# Patient Record
Sex: Male | Born: 1980 | Hispanic: Yes | Marital: Single | Smoking: Former smoker
Health system: Southern US, Community
[De-identification: ages and names within clinical notes are randomized; demographics above are authoritative.]

## PROBLEM LIST (undated history)

## (undated) DIAGNOSIS — I10 Essential (primary) hypertension: Secondary | ICD-10-CM

## (undated) HISTORY — PX: TONSILLECTOMY: SUR1361

## (undated) HISTORY — PX: APPENDECTOMY: SHX54

---

## 2021-12-23 ENCOUNTER — Emergency Department (HOSPITAL_COMMUNITY): Payer: Medicaid - Out of State

## 2021-12-23 ENCOUNTER — Other Ambulatory Visit: Payer: Self-pay

## 2021-12-23 ENCOUNTER — Encounter (HOSPITAL_COMMUNITY): Payer: Self-pay

## 2021-12-23 ENCOUNTER — Emergency Department (HOSPITAL_COMMUNITY)
Admission: EM | Admit: 2021-12-23 | Discharge: 2021-12-23 | Disposition: A | Discharge status: Home / Self Care | Payer: PRIVATE HEALTH INSURANCE | Attending: Emergency Medicine | Admitting: Emergency Medicine

## 2021-12-23 DIAGNOSIS — I10 Essential (primary) hypertension: Secondary | ICD-10-CM | POA: Insufficient documentation

## 2021-12-23 DIAGNOSIS — Z79899 Other long term (current) drug therapy: Secondary | ICD-10-CM | POA: Diagnosis not present

## 2021-12-23 DIAGNOSIS — R200 Anesthesia of skin: Secondary | ICD-10-CM | POA: Diagnosis present

## 2021-12-23 HISTORY — DX: Essential (primary) hypertension: I10

## 2021-12-23 LAB — BASIC METABOLIC PANEL
Anion gap: 6 (ref 5–15)
BUN: 20 mg/dL (ref 6–20)
CO2: 25 mmol/L (ref 22–32)
Calcium: 8.9 mg/dL (ref 8.9–10.3)
Chloride: 106 mmol/L (ref 98–111)
Creatinine, Ser: 1.04 mg/dL (ref 0.61–1.24)
GFR, Estimated: >60 mL/min (ref >60)
Glucose, Bld: 108 mg/dL — ABNORMAL HIGH (ref 70–99)
Potassium: 3.6 mmol/L (ref 3.5–5.1)
Sodium: 137 mmol/L (ref 135–145)

## 2021-12-23 LAB — CBC
HCT: 46.9 % (ref 39.0–52.0)
Hemoglobin: 15.8 g/dL (ref 13.0–17.0)
MCH: 33.5 pg (ref 26.0–34.0)
MCHC: 33.7 g/dL (ref 30.0–36.0)
MCV: 99.6 fL (ref 80.0–100.0)
Platelets: 275 10*3/uL (ref 150–400)
RBC: 4.71 MIL/uL (ref 4.22–5.81)
RDW: 11.9 % (ref 11.5–15.5)
WBC: 8.2 10*3/uL (ref 4.0–10.5)
nRBC: 0 % (ref 0.0–0.2)

## 2021-12-23 LAB — TROPONIN I (HIGH SENSITIVITY)
Troponin I (High Sensitivity): 13 ng/L (ref <18)
Troponin I (High Sensitivity): 15 ng/L (ref <18)

## 2021-12-23 MED ORDER — AMLODIPINE BESYLATE 10 MG PO TABS: 10.0000 mg | ORAL_TABLET | Freq: Every day | ORAL | 0 refills | Status: AC

## 2021-12-23 MED ORDER — METOPROLOL TARTRATE 5 MG/5ML IV SOLN
5.0000 mg | Freq: Once | INTRAVENOUS | Status: AC
  Administered 2021-12-23: 5 mg via INTRAVENOUS
  Filled 2021-12-23: qty 5

## 2021-12-23 MED ORDER — AMLODIPINE BESYLATE 5 MG PO TABS
10.0000 mg | ORAL_TABLET | Freq: Once | ORAL | Status: AC
  Administered 2021-12-23: 10 mg via ORAL
  Filled 2021-12-23: qty 2

## 2021-12-23 NOTE — ED Notes (Signed)
Patient transported to MRI 

## 2021-12-23 NOTE — ED Triage Notes (Signed)
Pt c/o chest pain and lip numbness since 0400.

## 2021-12-23 NOTE — ED Provider Notes (Signed)
[facility]-EMERGENCY DEPT Provider Note   CSN: [id number] Arrival date & time: 12/23/21  2025     History  Chief Complaint  Patient presents with   Chest Pain    Brett Lopez is a 41 y.o. male.  Pt is a 30 y o wm with a hx of htn.  Pt presents to the ED today with cp, left arm numbness, and lip numbness. He lives in [place], but is here working.  He arrived in September and plans to be here the rest of this year.  He has been on Lisinopril 20 mg for his bp.  He thought his bp may be high, so he took an extra dose of his bp medication.  He still felt bad, so came in.  He was able to swallow.  He was able to eat without problems this am.  No sob.  No f/c.      Home Medications Prior to Admission medications   Medication Sig Start Date End Date Taking? Authorizing Provider  amLODipine (NORVASC) 10 MG tablet Take 1 tablet (10 mg total) by mouth daily. 12/23/21  Yes Jacalyn Lefevre, MD      Allergies    Penicillins    Review of Systems   Review of Systems  Cardiovascular:  Positive for chest pain.  Neurological:  Positive for numbness.  All other systems reviewed and are negative.  Physical Exam Updated Vital Signs BP (!) 166/108    Pulse 60    Temp 98.1 F (36.7 C) (Oral)    Resp 16    Ht 6' (1.829 m)    Wt 136.1 kg    SpO2 99%    BMI 40.69 kg/m  Physical Exam Vitals and nursing note reviewed.  Constitutional:      Appearance: He is well-developed.  HENT:     Head: Normocephalic and atraumatic.  Eyes:     Extraocular Movements: Extraocular movements intact.     Pupils: Pupils are equal, round, and reactive to light.  Cardiovascular:     Rate and Rhythm: Normal rate and regular rhythm.     Heart sounds: Normal heart sounds.  Pulmonary:     Effort: Pulmonary effort is normal.     Breath sounds: Normal breath sounds.  Abdominal:     General: Bowel sounds are normal.     Palpations: Abdomen is soft.  Musculoskeletal:        General: Normal range of  motion.     Cervical back: Normal range of motion and neck supple.  Skin:    General: Skin is warm.     Capillary Refill: Capillary refill takes less than 2 seconds.  Neurological:     General: No focal deficit present.     Mental Status: He is alert and oriented to person, place, and time.  Psychiatric:        Mood and Affect: Mood normal.        Behavior: Behavior normal.    ED Results / Procedures / Treatments   Labs (all labs ordered are listed, but only abnormal results are displayed) Labs Reviewed  BASIC METABOLIC PANEL - Abnormal; Notable for the following components:      Result Value   Glucose, Bld 108 (*)    All other components within normal limits  CBC  TROPONIN I (HIGH SENSITIVITY)  TROPONIN I (HIGH SENSITIVITY)    EKG None  Radiology CT Head Wo Contrast  Result Date: 12/23/2021 CLINICAL DATA:  41 year old male with history of  neurologic deficit. Acute stroke suspected. EXAM: CT HEAD WITHOUT CONTRAST TECHNIQUE: Contiguous axial images were obtained from the base of the skull through the vertex without intravenous contrast. COMPARISON:  No priors. FINDINGS: Brain: Cavum septum pellucidum (normal anatomical variant) incidentally noted. No evidence of acute infarction, hemorrhage, hydrocephalus, extra-axial collection or mass lesion/mass effect. Vascular: No hyperdense vessel or unexpected calcification. Skull: Normal. Negative for fracture or focal lesion. Sinuses/Orbits: Extensive mucosal thickening is noted in the maxillary sinuses bilaterally. Other: None. IMPRESSION: 1. No acute intracranial abnormalities. The appearance of the brain is normal. 2. Extensive mucosal thickening in the maxillary sinuses bilaterally, suggestive of chronic sinusitis. Electronically Signed   By: Trudie Reed M.D.   On: 12/23/2021 07:55   MR BRAIN WO CONTRAST  Result Date: 12/23/2021 CLINICAL DATA:  Lip numbness, concern for stroke EXAM: MRI HEAD WITHOUT CONTRAST TECHNIQUE:  Multiplanar, multiecho pulse sequences of the brain and surrounding structures were obtained without intravenous contrast. COMPARISON:  Same day CT head FINDINGS: Brain: Is no evidence of acute intracranial hemorrhage, extra-axial fluid collection, or acute infarct. Parenchymal volume is normal. The ventricles are normal in size. Incidental note is made of a cavum septum pellucidum et vergae. There is no solid mass lesion.  There is no midline shift. Vascular: Normal flow voids. Skull and upper cervical spine: Normal marrow signal. Sinuses/Orbits: There is extensive mucosal thickening throughout the maxillary sinuses. The globes and orbits are unremarkable. Other: There is a 1.4 cm T2 hyperintense lesion demonstrating diffusion restriction in the left frontoparietal scalp which may reflect a sebaceous cyst. IMPRESSION: No acute intracranial pathology. Electronically Signed   By: Lesia Hausen M.D.   On: 12/23/2021 09:02   DG Chest Port 1 View  Result Date: 12/23/2021 CLINICAL DATA:  Chest pain. EXAM: PORTABLE CHEST 1 VIEW COMPARISON:  None. FINDINGS: No consolidation. No visible pleural effusions or pneumothorax. Cardiomediastinal silhouette is accentuated by AP technique. IMPRESSION: No evidence of acute cardiopulmonary disease. Electronically Signed   By: Feliberto Harts M.D.   On: 12/23/2021 07:57    Procedures Procedures    Medications Ordered in ED Medications  amLODipine (NORVASC) tablet 10 mg (has no administration in time range)  metoprolol tartrate (LOPRESSOR) injection 5 mg (5 mg Intravenous Given 12/23/21 0801)    ED Course/ Medical Decision Making/ A&P                           Medical Decision Making  Sx improving with bp lowering.  MRI and Cardiac work up negative.  As he does not have access to his pcp in [place], he is given a rx for norvasc.  He is going to the store to get a bp cuff.  He is encouraged to eat a healthier diet and to stop vaping.  Return if worse.  F/u with pcp when  he gets home.        Final Clinical Impression(s) / ED Diagnoses Final diagnoses:  Hypertension, unspecified type    Rx / DC Orders ED Discharge Orders          Ordered    amLODipine (NORVASC) 10 MG tablet  Daily        12/23/21 1005              Jacalyn Lefevre, MD 12/23/21 1007

## 2021-12-23 NOTE — ED Notes (Signed)
Brett Lopez, and Blue top sent as saves.

## 2023-07-18 IMAGING — MR MR HEAD W/O CM
10 series · 48 of 48 positions shown · non-contrast
Comparison: Same day CT head

CLINICAL DATA: Lip numbness, concern for stroke

EXAM:
MRI HEAD WITHOUT CONTRAST
TECHNIQUE: Multiplanar, multiecho pulse sequences of the brain and surrounding
structures were obtained without intravenous contrast.

[Series 5: DWI · axial · 3.0mm · 1.36mm/px · z∈[-56,+94]mm · 9 of 108 slices shown (1 of 2)]
[im 1/108]
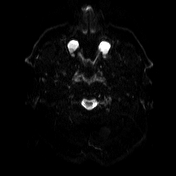
[im 14/108]
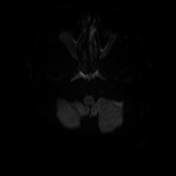
[im 27/108]
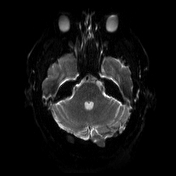
[im 41/108]
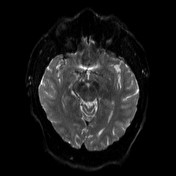
[im 54/108]
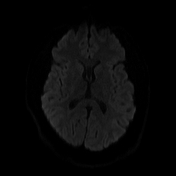
[im 67/108]
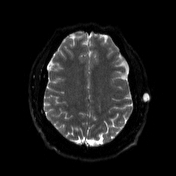
[im 81/108]
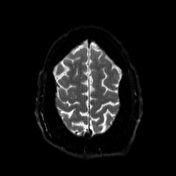
[im 94/108]
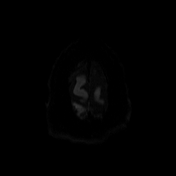
[im 108/108]
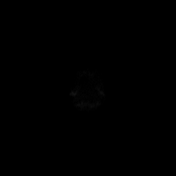

[Series 6: DWI · axial · 3.0mm · 1.36mm/px · z∈[-56,+94]mm · 4 of 54 slices shown (2 of 2)]
[im 1/54]
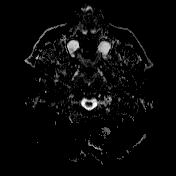
[im 18/54]
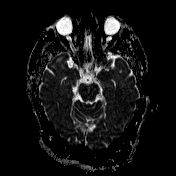
[im 36/54]
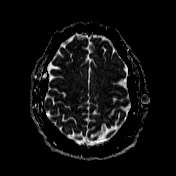
[im 54/54]
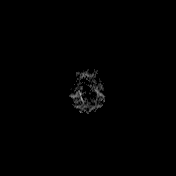

[Series 7: T1 · sagittal · 5.0mm · 0.75mm/px · 2 of 24 slices shown (1 of 2)]
[im 1/24]
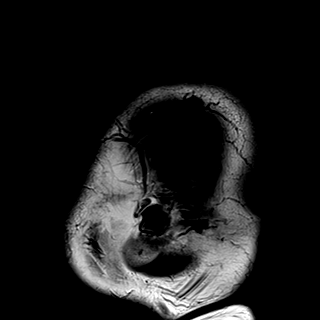
[im 24/24]
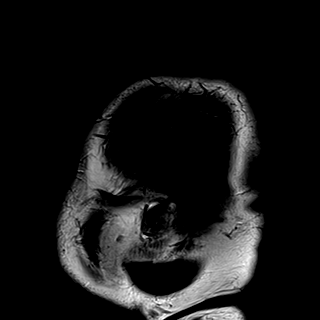

[Series 8: T2 · axial · 5.0mm · 0.62mm/px · z∈[-58,+95]mm · 2 of 26 slices shown (1 of 2)]
[im 1/26]
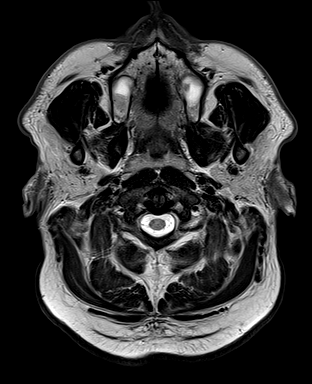
[im 26/26]
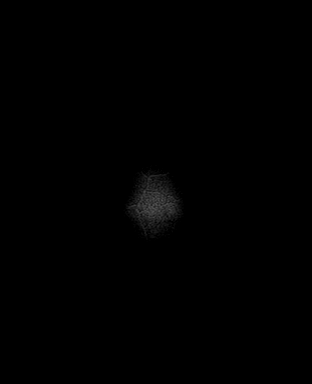

[Series 9: swi_images · axial · 3.0mm · 0.75mm/px · z∈[-53,+91]mm · 4 of 52 slices shown]
[im 1/52]
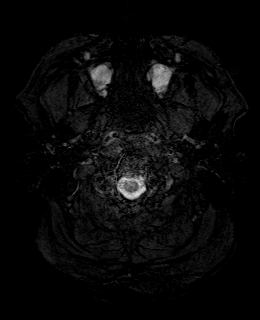
[im 18/52]
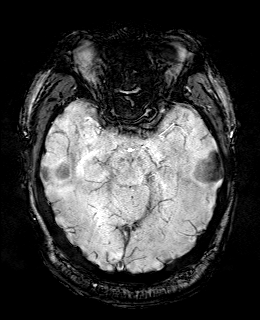
[im 35/52]
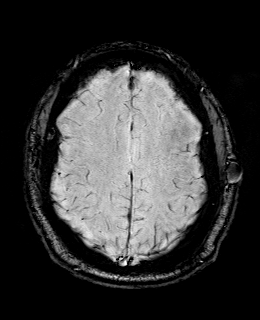
[im 52/52]
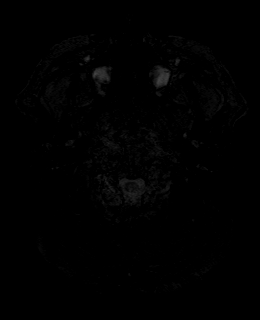

[Series 11: FLAIR · axial · 3.0mm · 0.75mm/px · z∈[-56,+94]mm · 4 of 54 slices shown]
[im 1/54]
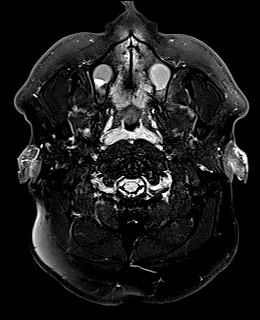
[im 18/54]
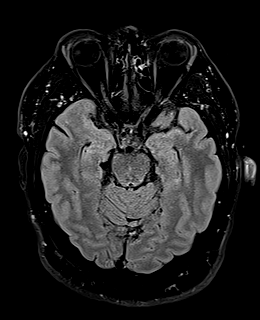
[im 36/54]
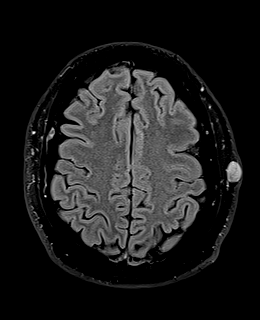
[im 54/54]
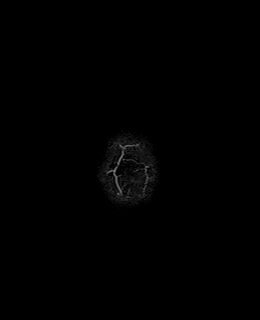

[Series 12: T1 · axial · 1.0mm · 0.94mm/px · z∈[-61,+91]mm · 13 of 160 slices shown (2 of 2)]
[im 1/160]
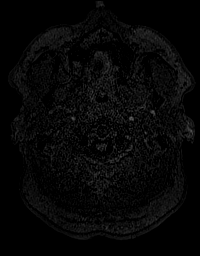
[im 14/160]
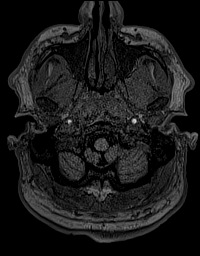
[im 27/160]
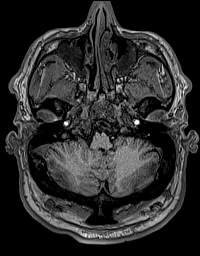
[im 40/160]
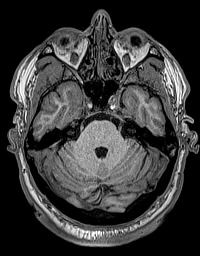
[im 54/160]
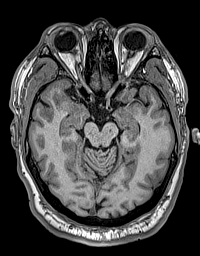
[im 67/160]
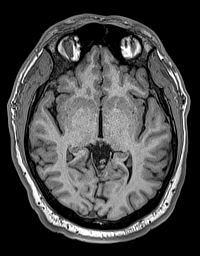
[im 80/160]
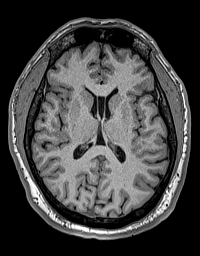
[im 93/160]
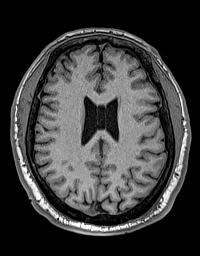
[im 107/160]
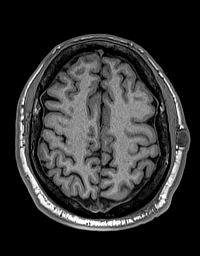
[im 120/160]
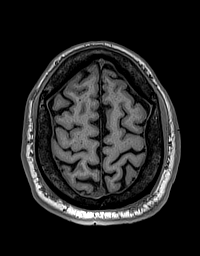
[im 133/160]
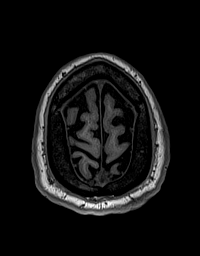
[im 146/160]
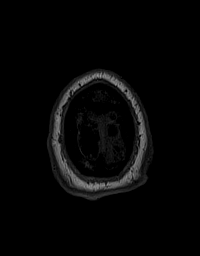
[im 160/160]
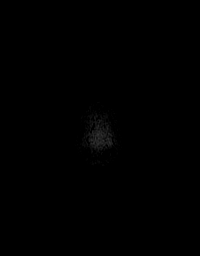

[Series 13: cor dwi_tracew · coronal · 5.0mm · 1.53mm/px · 5 of 56 slices shown]
[im 1/56]
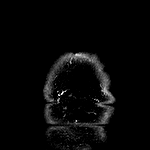
[im 14/56]
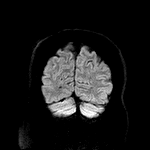
[im 28/56]
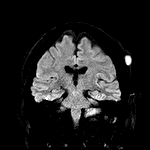
[im 42/56]
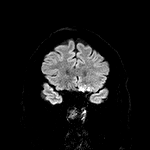
[im 56/56]
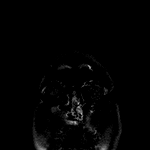

[Series 14: cor dwi_adc · coronal · 5.0mm · 1.53mm/px · 2 of 28 slices shown]
[im 1/28]
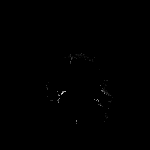
[im 28/28]
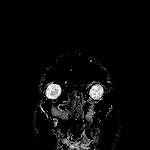

[Series 15: T2 · coronal · 5.0mm · 0.57mm/px · 3 of 34 slices shown (2 of 2)]
[im 1/34]
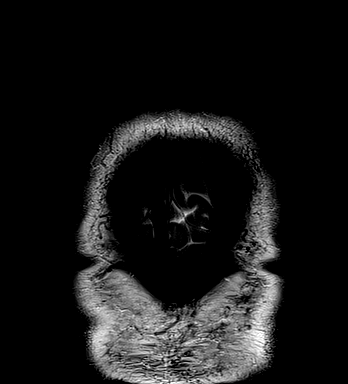
[im 17/34]
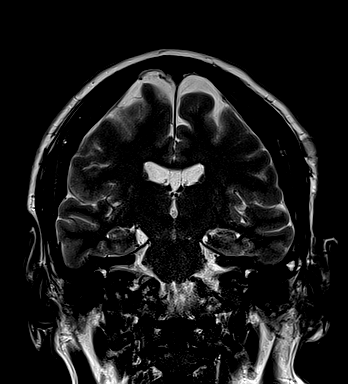
[im 34/34]
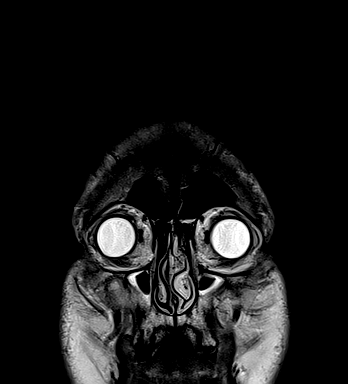

[48 of 48 positions shown; findings below may reference images not displayed]

FINDINGS: Brain: Is no evidence of acute intracranial hemorrhage, extra-axial
fluid collection, or acute infarct.

Parenchymal volume is normal. The ventricles are normal in size.
Incidental note is made of a cavum septum pellucidum et vergae.

There is no solid mass lesion.  There is no midline shift.

Vascular: Normal flow voids.

Skull and upper cervical spine: Normal marrow signal.

Sinuses/Orbits: There is extensive mucosal thickening throughout the
maxillary sinuses. The globes and orbits are unremarkable.

Other: There is a 1.4 cm T2 hyperintense lesion demonstrating
diffusion restriction in the left frontoparietal scalp which may
reflect a sebaceous cyst.
IMPRESSION: No acute intracranial pathology.
